# Patient Record
Sex: Male | Born: 1995 | Race: White | Hispanic: No | Marital: Married | Smoking: Never smoker
Health system: Southern US, Community
[De-identification: ages and names within clinical notes are randomized; demographics above are authoritative.]

## PROBLEM LIST (undated history)

## (undated) DIAGNOSIS — J45909 Unspecified asthma, uncomplicated: Secondary | ICD-10-CM

## (undated) HISTORY — PX: SURGERY SCROTAL / TESTICULAR: SUR1316

## (undated) HISTORY — DX: Unspecified asthma, uncomplicated: J45.909

---

## 2020-02-03 ENCOUNTER — Encounter (HOSPITAL_BASED_OUTPATIENT_CLINIC_OR_DEPARTMENT_OTHER): Payer: Self-pay | Admitting: Emergency Medicine

## 2020-02-03 ENCOUNTER — Emergency Department (HOSPITAL_BASED_OUTPATIENT_CLINIC_OR_DEPARTMENT_OTHER): Payer: 59

## 2020-02-03 ENCOUNTER — Emergency Department (HOSPITAL_BASED_OUTPATIENT_CLINIC_OR_DEPARTMENT_OTHER)
Admission: EM | Admit: 2020-02-03 | Discharge: 2020-02-03 | Disposition: A | Discharge status: Home / Self Care | Payer: 59 | Attending: Emergency Medicine | Admitting: Emergency Medicine

## 2020-02-03 ENCOUNTER — Other Ambulatory Visit: Payer: Self-pay

## 2020-02-03 DIAGNOSIS — R102 Pelvic and perineal pain: Secondary | ICD-10-CM | POA: Diagnosis present

## 2020-02-03 DIAGNOSIS — J45909 Unspecified asthma, uncomplicated: Secondary | ICD-10-CM | POA: Diagnosis not present

## 2020-02-03 DIAGNOSIS — K6389 Other specified diseases of intestine: Secondary | ICD-10-CM

## 2020-02-03 DIAGNOSIS — N50819 Testicular pain, unspecified: Secondary | ICD-10-CM | POA: Diagnosis not present

## 2020-02-03 DIAGNOSIS — K659 Peritonitis, unspecified: Secondary | ICD-10-CM | POA: Diagnosis not present

## 2020-02-03 LAB — URINALYSIS, ROUTINE W REFLEX MICROSCOPIC
Bilirubin Urine: NEGATIVE
Glucose, UA: NEGATIVE mg/dL
Hgb urine dipstick: NEGATIVE
Ketones, ur: NEGATIVE mg/dL
Leukocytes,Ua: NEGATIVE
Nitrite: NEGATIVE
Protein, ur: NEGATIVE mg/dL
Specific Gravity, Urine: 1.02 (ref 1.005–1.030)
pH: 6 (ref 5.0–8.0)

## 2020-02-03 LAB — CBC WITH DIFFERENTIAL/PLATELET
Abs Immature Granulocytes: 0.05 10*3/uL (ref 0.00–0.07)
Basophils Absolute: 0.1 10*3/uL (ref 0.0–0.1)
Basophils Relative: 1 %
Eosinophils Absolute: 0.1 10*3/uL (ref 0.0–0.5)
Eosinophils Relative: 1 %
HCT: 47.5 % (ref 39.0–52.0)
Hemoglobin: 16.3 g/dL (ref 13.0–17.0)
Immature Granulocytes: 1 %
Lymphocytes Relative: 17 %
Lymphs Abs: 1.9 10*3/uL (ref 0.7–4.0)
MCH: 31 pg (ref 26.0–34.0)
MCHC: 34.3 g/dL (ref 30.0–36.0)
MCV: 90.3 fL (ref 80.0–100.0)
Monocytes Absolute: 0.5 10*3/uL (ref 0.1–1.0)
Monocytes Relative: 5 %
Neutro Abs: 8.3 10*3/uL — ABNORMAL HIGH (ref 1.7–7.7)
Neutrophils Relative %: 75 %
Platelets: 318 10*3/uL (ref 150–400)
RBC: 5.26 MIL/uL (ref 4.22–5.81)
RDW: 12.7 % (ref 11.5–15.5)
WBC: 11 10*3/uL — ABNORMAL HIGH (ref 4.0–10.5)
nRBC: 0 % (ref 0.0–0.2)

## 2020-02-03 LAB — COMPREHENSIVE METABOLIC PANEL
ALT: 51 U/L — ABNORMAL HIGH (ref 0–44)
AST: 26 U/L (ref 15–41)
Albumin: 4.7 g/dL (ref 3.5–5.0)
Alkaline Phosphatase: 102 U/L (ref 38–126)
Anion gap: 13 (ref 5–15)
BUN: 12 mg/dL (ref 6–20)
CO2: 22 mmol/L (ref 22–32)
Calcium: 9.3 mg/dL (ref 8.9–10.3)
Chloride: 103 mmol/L (ref 98–111)
Creatinine, Ser: 1.02 mg/dL (ref 0.61–1.24)
GFR calc Af Amer: >60 mL/min (ref >60)
GFR calc non Af Amer: >60 mL/min (ref >60)
Glucose, Bld: 99 mg/dL (ref 70–99)
Potassium: 3.9 mmol/L (ref 3.5–5.1)
Sodium: 138 mmol/L (ref 135–145)
Total Bilirubin: 0.8 mg/dL (ref 0.3–1.2)
Total Protein: 8.2 g/dL — ABNORMAL HIGH (ref 6.5–8.1)

## 2020-02-03 LAB — LIPASE, BLOOD: Lipase: 19 U/L (ref 11–51)

## 2020-02-03 MED ORDER — KETOROLAC TROMETHAMINE 30 MG/ML IJ SOLN
30.0000 mg | Freq: Once | INTRAMUSCULAR | Status: AC
  Administered 2020-02-03: 30 mg via INTRAVENOUS
  Filled 2020-02-03: qty 1

## 2020-02-03 MED ORDER — IOHEXOL 300 MG/ML  SOLN
100.0000 mL | Freq: Once | INTRAMUSCULAR | Status: AC | PRN
  Administered 2020-02-03: 100 mL via INTRAVENOUS

## 2020-02-03 MED ORDER — ONDANSETRON 4 MG PO TBDP: 4.0000 mg | ORAL_TABLET | Freq: Three times a day (TID) | ORAL | 0 refills | Status: AC | PRN

## 2020-02-03 MED ORDER — HYDROCODONE-ACETAMINOPHEN 5-325 MG PO TABS: 1.0000 | ORAL_TABLET | Freq: Four times a day (QID) | ORAL | 0 refills | Status: AC | PRN

## 2020-02-03 NOTE — ED Provider Notes (Signed)
[facility] EMERGENCY DEPARTMENT Provider Note   CSN: [id number] Arrival date & time: 02/03/20  1053     History Chief Complaint  Patient presents with  . Groin Pain    Derek Herring is a 24 y.o. male past history of asthma who presents for evaluation of left lower abdominal pain that radiates in the groin that has been ongoing for the last 3 days.  He reports that about 3 days ago, he woke up with the pain.  He states that it was in his left lower quadrant and radiated into his groin.  Particularly into his penis testicle.  He states that for the last 3 days, the pain has been persistent.  He states it is constant but waxes and wanes in intensity.  He states it is not gotten any worse but has not resolved, prompting ED visit.  He states it is slightly better in the morning when he wakes up.  He does report that when he does certain movements, bending it is worse.  He has not taken medications for the symptoms.  He reports that his last bowel movement was this morning.  He did have some pain with straining a bowel movement.  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  No other injury that he can recall.  He does do a lot of heavy lifting at work.  He went to urgent care and was sent to the emergency department for further evaluation.  He has been able to urinate without any difficulty.  Denies any fevers, chest pain, difficulty breathing, dysuria, hematuria, testicular swelling, warmth, erythema, nausea/vomiting.  The history is provided by the patient.       Past Medical History:  Diagnosis Date  . Asthma     There are no problems to display for this patient.   Past Surgical History:  Procedure Laterality Date  . SURGERY SCROTAL / TESTICULAR         History reviewed. No pertinent family history.  Social History   Tobacco Use  . Smoking status: Never Smoker  .  Smokeless tobacco: Never Used  Substance Use Topics  . Alcohol use: Never  . Drug use: Never    Home Medications Prior to Admission medications   Medication Sig Start Date End Date Taking? Authorizing Provider  HYDROcodone-acetaminophen (NORCO/VICODIN) 5-325 MG tablet Take 1-2 tablets by mouth every 6 (six) hours as needed. 02/03/20   Maxwell CaulLayden, Ginamarie Banfield A, PA-C  ondansetron (ZOFRAN ODT) 4 MG disintegrating tablet Take 1 tablet (4 mg total) by mouth every 8 (eight) hours as needed for nausea or vomiting. 02/03/20   Maxwell CaulLayden, Halee Glynn A, PA-C    Allergies    Patient has no known allergies.  Review of Systems   Review of Systems  Constitutional: Negative for fever.  Respiratory: Negative for cough and shortness of breath.   Cardiovascular: Negative for chest pain.  Gastrointestinal: Positive for abdominal pain. Negative for nausea and vomiting.  Genitourinary: Positive for penile pain and testicular pain. Negative for discharge, dysuria and hematuria.  Neurological: Negative for weakness, numbness and headaches.  All other systems reviewed and are negative.   Physical Exam Updated Vital Signs BP 133/76 (BP Location: Left Arm)   Pulse 73   Temp 98.3 F (36.8 C) (Oral)   Resp 16   Ht 5\' 8"  (1.727 m)   Wt 86.2 kg  SpO2 100%   BMI 28.89 kg/m   Physical Exam Vitals and nursing note reviewed. Exam conducted with a chaperone present.  Constitutional:      Appearance: Normal appearance. He is well-developed.     Comments: Sitting comfortably on examination table  HENT:     Head: Normocephalic and atraumatic.  Eyes:     General: Lids are normal.     Conjunctiva/sclera: Conjunctivae normal.     Pupils: Pupils are equal, round, and reactive to light.  Cardiovascular:     Rate and Rhythm: Normal rate and regular rhythm.     Pulses: Normal pulses.     Heart sounds: Normal heart sounds. No murmur heard.  No friction rub. No gallop.   Pulmonary:     Effort: Pulmonary effort is normal.      Breath sounds: Normal breath sounds.  Abdominal:     Palpations: Abdomen is soft. Abdomen is not rigid.     Tenderness: There is abdominal tenderness in the right lower quadrant, suprapubic area and left lower quadrant. There is no right CVA tenderness, left CVA tenderness or guarding.     Hernia: No hernia is present.     Comments: Abdomen soft, nondistended.  Mild abdominal tenderness in lower abdomen diffusely.  No rigidity, guarding.  No CVA tenderness noted bilaterally.  No palpable hernia.  Genitourinary:    Penis: Normal and circumcised.      Testes:        Right: Cremasteric reflex is present.      Comments: The exam was performed with a chaperone present. Normal male genitalia. No evidence of rash, ulcers or lesions.  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  Right testicle is without any evidence of warmth, erythema, edema.  Patient states that sometimes it hurts but he does not have any explicit tenderness palpation when I palpate the testicle. Cremasteric reflex present.  Penis is without any warmth, erythema, edema.  No penile discharge.  No evidence of hernia bilaterally. Musculoskeletal:        General: Normal range of motion.     Cervical back: Full passive range of motion without pain.  Skin:    General: Skin is warm and dry.     Capillary Refill: Capillary refill takes less than 2 seconds.  Neurological:     Mental Status: He is alert and oriented to person, place, and time.  Psychiatric:        Speech: Speech normal.     ED Results / Procedures / Treatments   Labs (all labs ordered are listed, but only abnormal results are displayed) Labs Reviewed  COMPREHENSIVE METABOLIC PANEL - Abnormal; Notable for the following components:      Result Value   Total Protein 8.2 (*)    ALT 51 (*)    All other components within normal limits  CBC WITH DIFFERENTIAL/PLATELET - Abnormal; Notable for the following components:   WBC 11.0 (*)    Neutro Abs 8.3 (*)    All other components within  normal limits  LIPASE, BLOOD  URINALYSIS, ROUTINE W REFLEX MICROSCOPIC    EKG None  Radiology CT ABDOMEN PELVIS W CONTRAST  Result Date: 02/03/2020 CLINICAL DATA:  Left lower quadrant abdominal pain for 2 days. EXAM: CT ABDOMEN AND PELVIS WITH CONTRAST TECHNIQUE: Multidetector CT imaging of the abdomen and pelvis was performed using the standard protocol following bolus administration of intravenous contrast. CONTRAST:  OMNIPAQUE IOHEXOL 300 MG/ML  SOLN COMPARISON:  None. FINDINGS: Lower chest: The lung bases are  clear of acute process. No pleural effusion or pulmonary lesions. The heart is normal in size. No pericardial effusion. The distal esophagus and aorta are unremarkable. Hepatobiliary: No focal hepatic lesions or intrahepatic biliary dilatation. The gallbladder is normal. No common bile duct dilatation. Pancreas: No mass, inflammation or ductal dilatation. Spleen: Normal size.  No focal lesions. Adrenals/Urinary Tract: Adrenal glands and kidneys are normal. The bladder is normal. Stomach/Bowel: The stomach, duodenum, small bowel and colon are grossly normal without oral contrast. No acute inflammatory changes, mass lesions or obstructive findings. The terminal ileum and appendix are normal. There is a focus of epiploic appendagitis near the distal descending colon sigmoid colon junction region. This is a benign self-limiting nonsurgical process. Vascular/Lymphatic: The aorta is normal in caliber. No dissection. The branch vessels are patent. The major venous structures are patent. No mesenteric or retroperitoneal mass or adenopathy. Small scattered lymph nodes are noted. Reproductive: The prostate gland and seminal vesicles are unremarkable. Other: There is a small amount of free pelvic fluid likely related to the epiploic appendagitis. Musculoskeletal: No significant bony findings. IMPRESSION: 1. Epiploic appendagitis near the distal descending colon sigmoid colon junction region. This is a  benign self-limiting nonsurgical process. 2. No other significant abdominal/pelvic findings, mass lesions or adenopathy. Electronically Signed   By: Rudie Meyer M.D.   On: 02/03/2020 13:14    Procedures Procedures (including critical care time)  Medications Ordered in ED Medications  iohexol (OMNIPAQUE) 300 MG/ML solution 100 mL (100 mLs Intravenous Contrast Given 02/03/20 1232)  ketorolac (TORADOL) 30 MG/ML injection 30 mg (30 mg Intravenous Given 02/03/20 1411)    ED Course  I have reviewed the triage vital signs and the nursing notes.  Pertinent labs & imaging results that were available during my care of the patient were reviewed by me and considered in my medical decision making (see chart for details).    MDM Rules/Calculators/A&P                          24 y.o. M who presents for evaluation of left lower abdominal pain that radiates into his groin x 3 days. No fevers, nausea/vomiting. Patient is afebrile, non-toxic appearing, sitting comfortably on examination table. Vital signs reviewed and stable.  On exam, he has some mild tenderness in the lower abdomen, tickly in the left lower quadrant.  No rigidity, guarding.  On GU exam, his left testicle is absent.  Patient states that he got it surgically removed.  Unclear why.  He states that his parents told him "something twisted around and he got his testicle removed."  Right testicle is without any warmth, erythema, edema.  He reports some tenderness to the testicle intermittently but does not have any focal tenderness when I palpate the testicle.  No discoloration, swelling.  Consider infectious etiology versus GU etiology versus intermittent hernia.  History/physical exam is not concerning for testicular torsion.  Doubt kidney stone.  Plan to check labs, imaging.  Lipase is normal.  CMP shows normal BUN and creatinine.  UA negative for any infectious etiology.  CBC shows slight leukocytosis at 11.0.  CT abd/pelvis shows epiploic  appendagitis near the distal descending colon. No other acute abnormalities.   Discussed results with patient.  Patient is well-appearing on the bed.  Vital signs are stable.  Repeat GU exam shows no tenderness noted to the right testicle.  No overlying warmth, erythema, edema.  Cremasteric reflexes present.  I discussed with patient her  findings of epiploic appendagitis on CT scan.  This is located in the sigmoid colon where he is having his abdominal tenderness.  Suspect that his symptoms are most likely related to findings on CT scan.  I discussed with patient there is no evidence of hernia on his CT scan.  Additionally, I discussed with patient that his exam is not concerning for testicular torsion.  He has good cremasteric reflex, the testicle is not tender, warm, discolored, swollen.  We engaged in extensive shared decision making.  I discussed with him that the definitive way of evaluating for testicular torsion would be ultrasound.  Unfortunate this time, we do not have ultrasound capability.  After extensive discussion, we engaged in shared decision making.  Patient wished to be discharged home with treatment for epiploic appendagitis and closely monitor symptoms.  He is going to return if he has any testicular pain, swelling.  Additionally, I did offer him transfer to another facility for further ultrasound evaluation of testicular torsion.  I discussed with him that my suspicion was low.  We engaged in shared decision making patient opted to decline ultrasound at this time.  Given his reassuring exam, I feel this is reasonable.  We will plan to treat with short course of pain medication.  Encouraged at home supportive care measures. At this time, patient exhibits no emergent life-threatening condition that require further evaluation in ED or admission. Patient had ample opportunity for questions and discussion. All patient's questions were answered with full understanding. Strict return precautions  discussed. Patient expresses understanding and agreement to plan.   Portions of this note were generated with Scientist, clinical (histocompatibility and immunogenetics). Dictation errors may occur despite best attempts at proofreading.   Final Clinical Impression(s) / ED Diagnoses Final diagnoses:  Epiploic appendagitis    Rx / DC Orders ED Discharge Orders         Ordered    ondansetron (ZOFRAN ODT) 4 MG disintegrating tablet  Every 8 hours PRN     Discontinue  Reprint     02/03/20 1435    HYDROcodone-acetaminophen (NORCO/VICODIN) 5-325 MG tablet  Every 6 hours PRN     Discontinue  Reprint     02/03/20 1435           Rosana Hoes 02/03/20 1538    Linwood Dibbles, MD 02/04/20 1001

## 2020-02-03 NOTE — Discharge Instructions (Signed)
As we discussed, your work-up today was very reassuring.  Your CT scan did show evidence of epiploic appendagitis.  As we discussed, this can cause pain and it is in the area where you are experiencing pain.  You can take Tylenol or Ibuprofen as directed for pain. You can alternate Tylenol and Ibuprofen every 4 hours. If you take Tylenol at 1pm, then you can take Ibuprofen at 5pm. Then you can take Tylenol again at 9pm.   Take pain medications as directed for break through pain. Do not drive or operate machinery while taking this medication.   As we discussed, closely monitor your symptoms.  Return to the emergency department for any worsening pain, fever, vomiting, swelling of your testicle, discoloration of your testicle or any other worsening or concerning symptoms.

## 2020-02-03 NOTE — ED Triage Notes (Signed)
Patient states that he woke up 2 days ago with left lower abdominal pain that now radiates down to his groin, he went to the MD and was sent here for an ultrasound. LBm this am

## 2020-02-03 NOTE — ED Notes (Signed)
ED Provider at bedside. 

## 2022-01-31 IMAGING — CT CT ABD-PELV W/ CM
2 of 4 series · 16 of 46 positions shown, 18 images · IV contrast (Omnipaque)
Comparison: None.

CLINICAL DATA: Left lower quadrant abdominal pain for 2 days.

EXAM:
CT ABDOMEN AND PELVIS WITH CONTRAST
TECHNIQUE: Multidetector CT imaging of the abdomen and pelvis was performed
using the standard protocol following bolus administration of
intravenous contrast.
CONTRAST:  100mL OMNIPAQUE IOHEXOL 300 MG/ML  SOLN

[Series 2: axial st · axial · 0.82mm/px · z∈[-604,-94]mm · 13 of 112 slices shown, 15 images]
[im 5/112  soft-tissue]
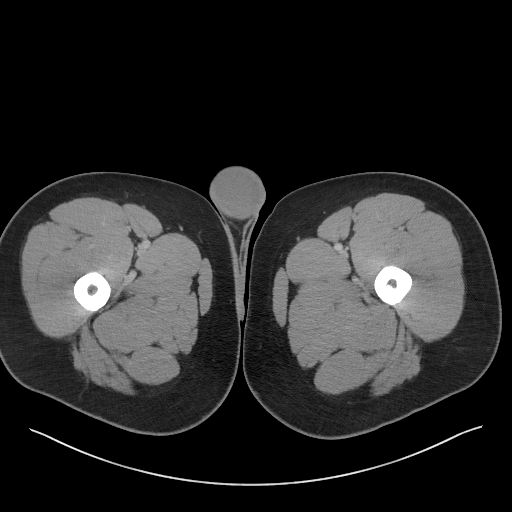
[im 5/112  bone]
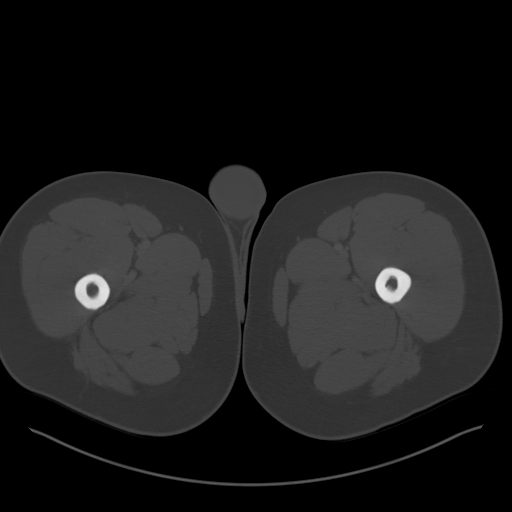
[im 14/112  soft-tissue]
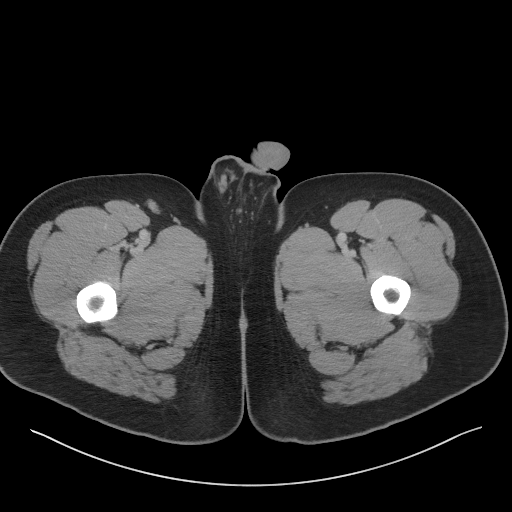
[im 23/112  soft-tissue]
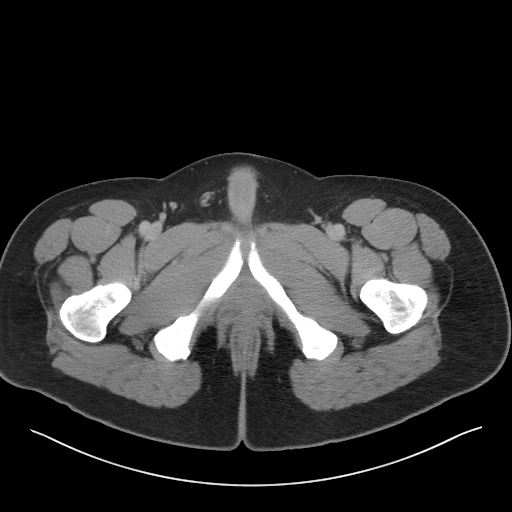
[im 32/112  soft-tissue]
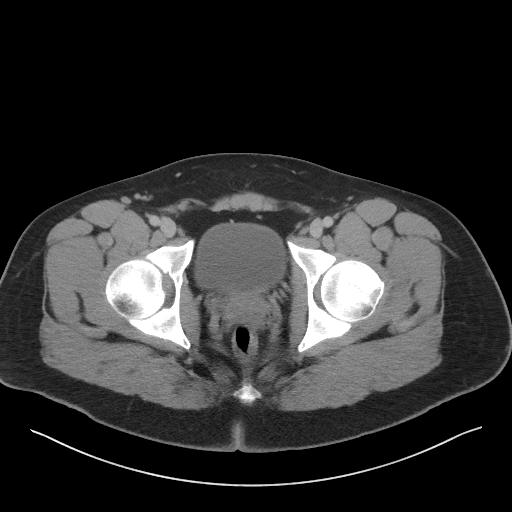
[im 40/112  soft-tissue]
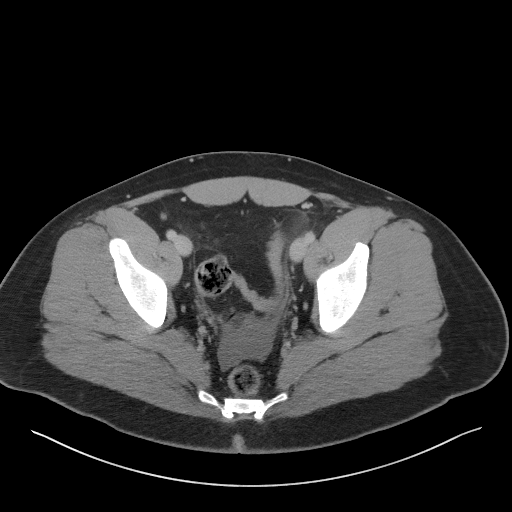
[im 49/112  soft-tissue]
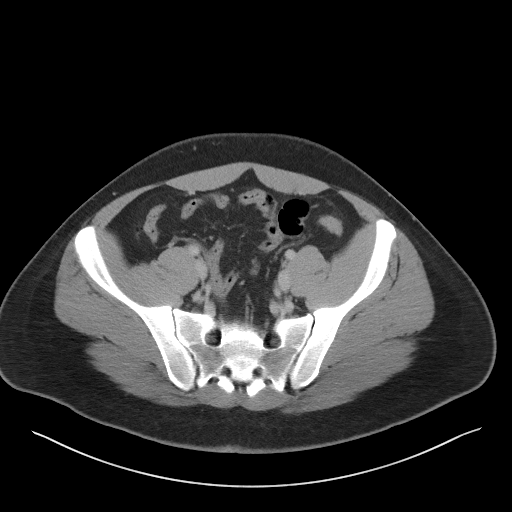
[im 58/112  soft-tissue]
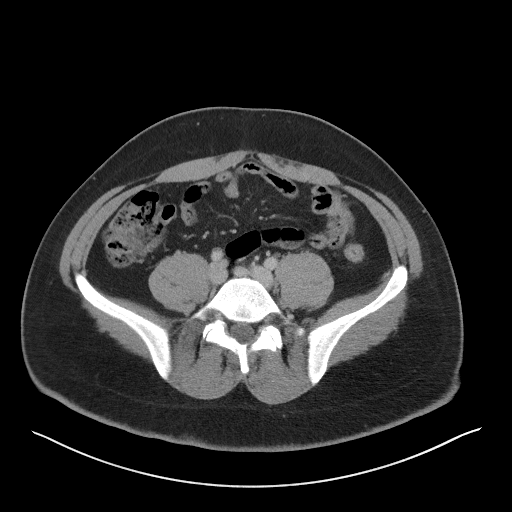
[im 63/112  soft-tissue]
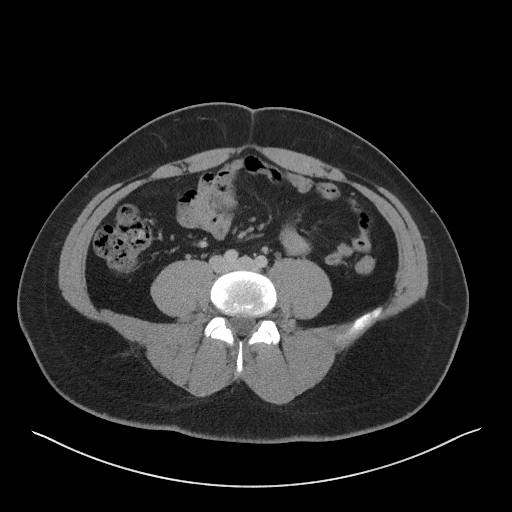
[im 72/112  soft-tissue]
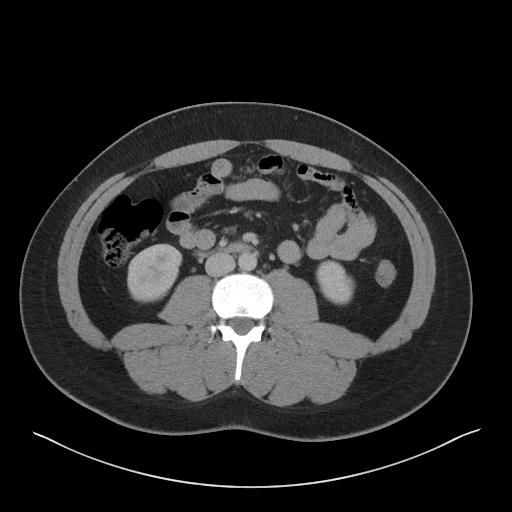
[im 72/112  bone]
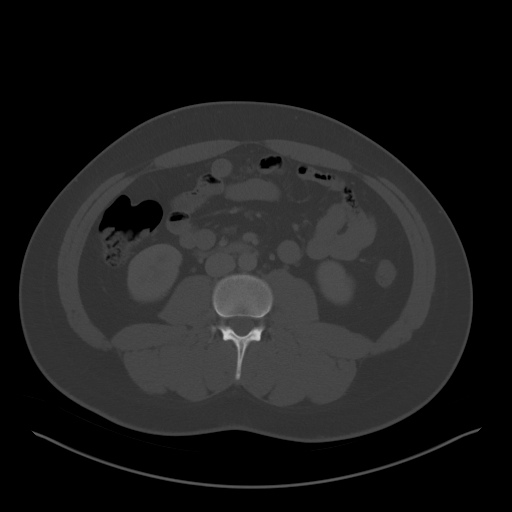
[im 80/112  soft-tissue]
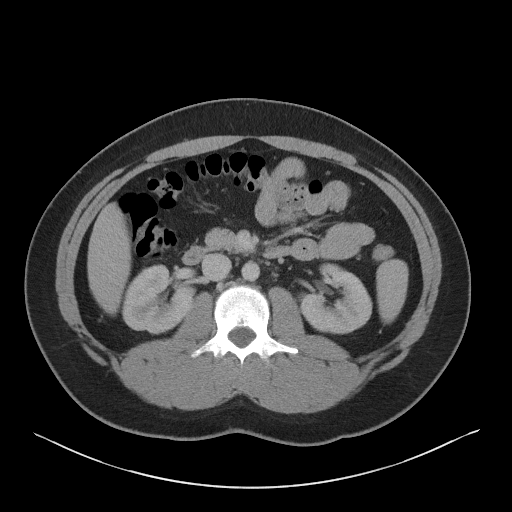
[im 89/112  soft-tissue]
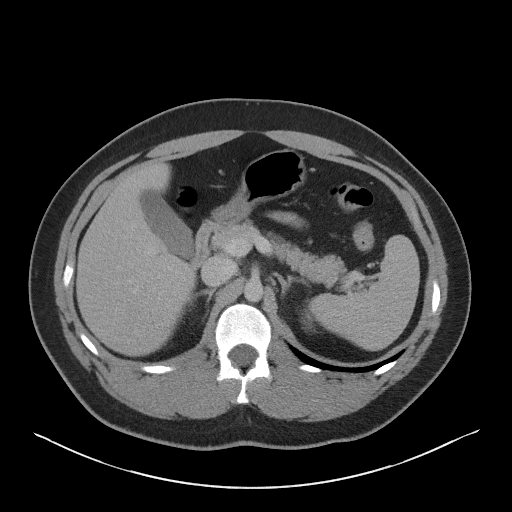
[im 98/112  soft-tissue]
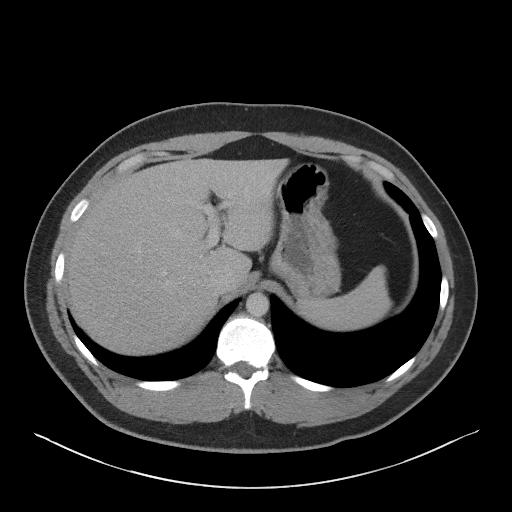
[im 107/112  soft-tissue]
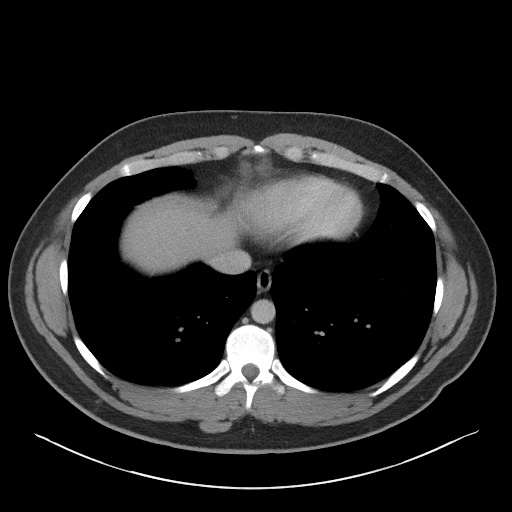

[Series 5: coronal st · coronal · 0.89mm/px · 3 of 101 slices shown]
[im 34/101  soft-tissue]
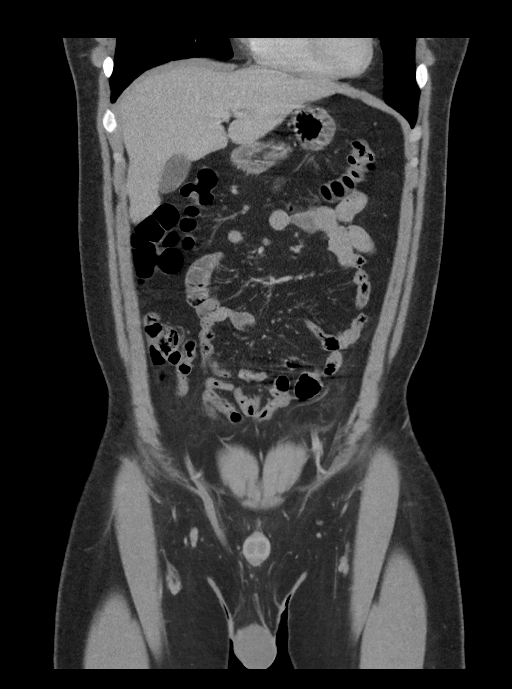
[im 45/101  soft-tissue]
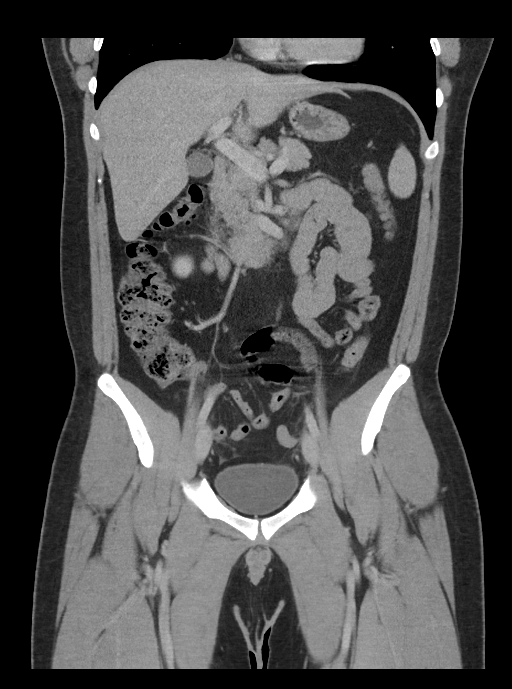
[im 56/101  soft-tissue]
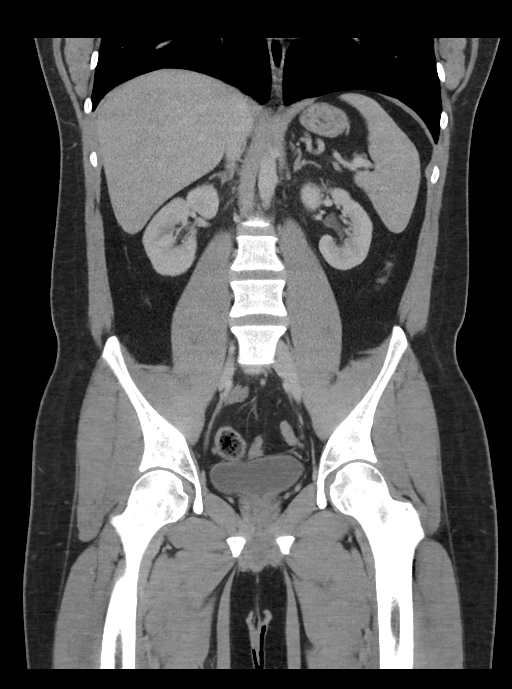

[16 of 46 positions shown; findings below may reference images not displayed]

FINDINGS: Lower chest: The lung bases are clear of acute process. No pleural
effusion or pulmonary lesions. The heart is normal in size. No
pericardial effusion. The distal esophagus and aorta are
unremarkable.

Hepatobiliary: No focal hepatic lesions or intrahepatic biliary
dilatation. The gallbladder is normal. No common bile duct
dilatation.

Pancreas: No mass, inflammation or ductal dilatation.

Spleen: Normal size.  No focal lesions.

Adrenals/Urinary Tract: Adrenal glands and kidneys are normal. The
bladder is normal.

Stomach/Bowel: The stomach, duodenum, small bowel and colon are
grossly normal without oral contrast. No acute inflammatory changes,
mass lesions or obstructive findings. The terminal ileum and
appendix are normal.

There is a focus of epiploic appendagitis near the distal descending
colon sigmoid colon junction region. This is a benign self-limiting
nonsurgical process.

Vascular/Lymphatic: The aorta is normal in caliber. No dissection.
The branch vessels are patent. The major venous structures are
patent. No mesenteric or retroperitoneal mass or adenopathy. Small
scattered lymph nodes are noted.

Reproductive: The prostate gland and seminal vesicles are
unremarkable.

Other: There is a small amount of free pelvic fluid likely related
to the epiploic appendagitis.

Musculoskeletal: No significant bony findings.
IMPRESSION: 1. Epiploic appendagitis near the distal descending colon sigmoid
colon junction region. This is a benign self-limiting nonsurgical
process.
2. No other significant abdominal/pelvic findings, mass lesions or
adenopathy.
